# Patient Record
Sex: Female | Born: 1995 | Race: White | Hispanic: No | Marital: Single | Smoking: Never smoker
Health system: Southern US, Community
[De-identification: ages and names within clinical notes are randomized; demographics above are authoritative.]

## PROBLEM LIST (undated history)

## (undated) DIAGNOSIS — G43909 Migraine, unspecified, not intractable, without status migrainosus: Secondary | ICD-10-CM

---

## 2016-08-28 ENCOUNTER — Encounter (HOSPITAL_COMMUNITY): Payer: Self-pay | Admitting: Nurse Practitioner

## 2016-08-28 DIAGNOSIS — R202 Paresthesia of skin: Secondary | ICD-10-CM | POA: Diagnosis present

## 2016-08-28 LAB — COMPREHENSIVE METABOLIC PANEL
ALT: 13 U/L — AB (ref 14–54)
ANION GAP: 9 (ref 5–15)
AST: 17 U/L (ref 15–41)
Albumin: 4.1 g/dL (ref 3.5–5.0)
Alkaline Phosphatase: 41 U/L (ref 38–126)
BUN: 14 mg/dL (ref 6–20)
CHLORIDE: 107 mmol/L (ref 101–111)
CO2: 22 mmol/L (ref 22–32)
Calcium: 9.2 mg/dL (ref 8.9–10.3)
Creatinine, Ser: 0.84 mg/dL (ref 0.44–1.00)
GFR calc non Af Amer: >60 mL/min (ref >60)
GLUCOSE: 122 mg/dL — AB (ref 65–99)
Potassium: 3.5 mmol/L (ref 3.5–5.1)
Sodium: 138 mmol/L (ref 135–145)
Total Bilirubin: 0.4 mg/dL (ref 0.3–1.2)
Total Protein: 7 g/dL (ref 6.5–8.1)

## 2016-08-28 LAB — CBC
HCT: 39.9 % (ref 36.0–46.0)
HEMOGLOBIN: 13.9 g/dL (ref 12.0–15.0)
MCH: 31 pg (ref 26.0–34.0)
MCHC: 34.8 g/dL (ref 30.0–36.0)
MCV: 89.1 fL (ref 78.0–100.0)
Platelets: 332 10*3/uL (ref 150–400)
RBC: 4.48 MIL/uL (ref 3.87–5.11)
RDW: 12.4 % (ref 11.5–15.5)
WBC: 5.4 10*3/uL (ref 4.0–10.5)

## 2016-08-28 NOTE — ED Triage Notes (Signed)
Pt states this a.m when she woke up, she noticed that her left leg felt numb and had pins and needles sensation. On the course of the day, the symptoms progressed to left arm. She denies any neurological or autoimmune hx  Except family hx of such.

## 2016-08-29 ENCOUNTER — Emergency Department (HOSPITAL_COMMUNITY): Payer: 59

## 2016-08-29 ENCOUNTER — Emergency Department (HOSPITAL_COMMUNITY)
Admission: EM | Admit: 2016-08-29 | Discharge: 2016-08-29 | Disposition: A | Discharge status: Home / Self Care | Payer: 59 | Attending: Emergency Medicine | Admitting: Emergency Medicine

## 2016-08-29 DIAGNOSIS — R202 Paresthesia of skin: Secondary | ICD-10-CM

## 2016-08-29 HISTORY — DX: Migraine, unspecified, not intractable, without status migrainosus: G43.909

## 2016-08-29 NOTE — ED Provider Notes (Signed)
[facility] DEPT Provider Note: Lowella Dell, MD, FACEP  CSN: [id number] MRN: [id number] ARRIVAL: 08/28/16 at 2234 ROOM: WA13/[facility]13   CHIEF COMPLAINT  Numbness (Left Sided)   HISTORY OF PRESENT ILLNESS  08/29/16 4:35 AM Natalie Warren is a 21 y.o. female who awoke yesterday morning with paresthesias in her left leg. She describes the paresthesias as a tingling feeling like "my leg went to sleep and is waking back up". She is not completely insensate. The paresthesias began about mid thigh and are present distally. About 8 PM yesterday evening she noticed paresthesias in her left arm. These paresthesias are present in the upper arm and forearm but spare the hand. She is not having any paresthesias of the trunk or face. There is no associated pain. Nothing makes the paresthesias better or worse. She has had no focal weakness. She has had no visual changes. She has had no ataxia or disequilibrium. She deniesheadache, fever, chills, nausea, vomiting, diarrhea, chest pain, shortness of breath, cough and abdominal pain. She is currently on her menses.   Past Medical History:  Diagnosis Date  . Migraine     History reviewed. No pertinent surgical history.  History reviewed. No pertinent family history.  Social History  Substance Use Topics  . Smoking status: Never Smoker  . Smokeless tobacco: Never Used  . Alcohol use Yes    Prior to Admission medications   Not on File    Allergies Patient has no known allergies.   REVIEW OF SYSTEMS  Negative except as noted here or in the History of Present Illness.   PHYSICAL EXAMINATION  Initial Vital Signs Blood pressure 127/83, pulse 84, temperature 98.6 F (37 C), temperature source Oral, resp. rate 16, weight 68 kg (150 lb), last menstrual period 08/28/2016, SpO2 98 %.  Examination General: Well-developed, well-nourished female in no acute distress; appearance consistent with age of record HENT: normocephalic;  atraumatic Eyes: pupils equal, round and reactive to light; extraocular muscles intact Neck: supple; no meningeal signs Heart: regular rate and rhythm Lungs: clear to auscultation bilaterally Abdomen: soft; nondistended; nontender; bowel sounds present Extremities: No deformity; full range of motion; pulses normal Neurologic: Awake, alert and oriented; motor function intact in all extremities and symmetric, strength +5/5; sensation intact bilaterally with subjectively altered sensation of the left forehead, left upper arm and forearm, and left lower extremity; no facial droop; tongue midline; negative Romberg; normal finger to nose; normal coordination, speech and gait Skin: Warm and dry Psychiatric: Normal mood and affect   RESULTS  Summary of this visit's results, reviewed by myself:   EKG Interpretation  Date/Time:    Ventricular Rate:    PR Interval:    QRS Duration:   QT Interval:    QTC Calculation:   R Axis:     Text Interpretation:        Laboratory Studies: Results for orders placed or performed during the hospital encounter of 08/29/16 (from the past 24 hour(s))  Comprehensive metabolic panel     Status: Abnormal   Collection Time: 08/28/16 11:05 PM  Result Value Ref Range   Sodium 138 135 - 145 mmol/L   Potassium 3.5 3.5 - 5.1 mmol/L   Chloride 107 101 - 111 mmol/L   CO2 22 22 - 32 mmol/L   Glucose, Bld 122 (H) 65 - 99 mg/dL   BUN 14 6 - 20 mg/dL   Creatinine, Ser 7.82 0.44 - 1.00 mg/dL   Calcium 9.2 8.9 - 95.6 mg/dL   Total  Protein 7.0 6.5 - 8.1 g/dL   Albumin 4.1 3.5 - 5.0 g/dL   AST 17 15 - 41 U/L   ALT 13 (L) 14 - 54 U/L   Alkaline Phosphatase 41 38 - 126 U/L   Total Bilirubin 0.4 0.3 - 1.2 mg/dL   GFR calc non Af Amer >60 >60 mL/min   GFR calc Af Amer >60 >60 mL/min   Anion gap 9 5 - 15  CBC     Status: None   Collection Time: 08/28/16 11:05 PM  Result Value Ref Range   WBC 5.4 4.0 - 10.5 K/uL   RBC 4.48 3.87 - 5.11 MIL/uL   Hemoglobin 13.9 12.0  - 15.0 g/dL   HCT 30.1 31.4 - 38.8 %   MCV 89.1 78.0 - 100.0 fL   MCH 31.0 26.0 - 34.0 pg   MCHC 34.8 30.0 - 36.0 g/dL   RDW 87.5 79.7 - 28.2 %   Platelets 332 150 - 400 K/uL   Imaging Studies: Ct Head Wo Contrast  Result Date: 08/29/2016 CLINICAL DATA:  LEFT-sided numbness, pins and needles sensation. EXAM: CT HEAD WITHOUT CONTRAST TECHNIQUE: Contiguous axial images were obtained from the base of the skull through the vertex without intravenous contrast. COMPARISON:  None. FINDINGS: BRAIN: No intraparenchymal hemorrhage, mass effect nor midline shift. The ventricles and sulci are normal. No acute large vascular territory infarcts. No abnormal extra-axial fluid collections. Basal cisterns are patent. VASCULAR: Unremarkable. SKULL/SOFT TISSUES: No skull fracture. No significant soft tissue swelling. ORBITS/SINUSES: The included ocular globes and orbital contents are normal.The mastoid aircells and included paranasal sinuses are well-aerated. OTHER: None. IMPRESSION: Normal noncontrast CT HEAD. Electronically Signed   By: Awilda Metro M.D.   On: 08/29/2016 05:08    ED COURSE  Nursing notes and initial vitals signs, including pulse oximetry, reviewed.  Vitals:   08/28/16 2250 08/29/16 0417  BP: 139/88 127/83  Pulse: 93 84  Resp: 20 16  Temp: 98.6 F (37 C)   TempSrc: Oral   SpO2: 99% 98%  Weight: 68 kg (150 lb)    5:52 AM Transfer to [facility] ED for MRI of the brain.  PROCEDURES    ED DIAGNOSES     ICD-10-CM   1. Paresthesias R20.2        Paula Libra, MD 08/29/16 [phone]

## 2016-08-29 NOTE — Discharge Instructions (Signed)
Proceed to the [facility] emergency department for an MRI of the brain. Tell them you were sent from [facility] because of left-sided paresthesias and no MRI was available there today.

## 2016-08-29 NOTE — ED Notes (Signed)
Report called to charge nurse.

## 2018-01-22 IMAGING — CT CT HEAD W/O CM
3 of 4 series · 16 of 47 positions shown, 19 images · non-contrast
Comparison: None.

CLINICAL DATA: LEFT-sided numbness, pins and needles sensation.

EXAM:
CT HEAD WITHOUT CONTRAST
TECHNIQUE: Contiguous axial images were obtained from the base of the skull
through the vertex without intravenous contrast.

[Series 2: head w/o · axial · non-contrast · 0.45mm/px · z∈[-193,-73]mm · 10 of 30 slices shown, 13 images]
[im 3/30  brain]
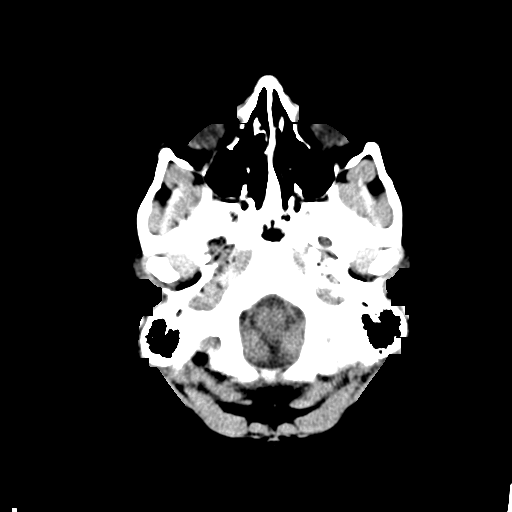
[im 3/30  bone]
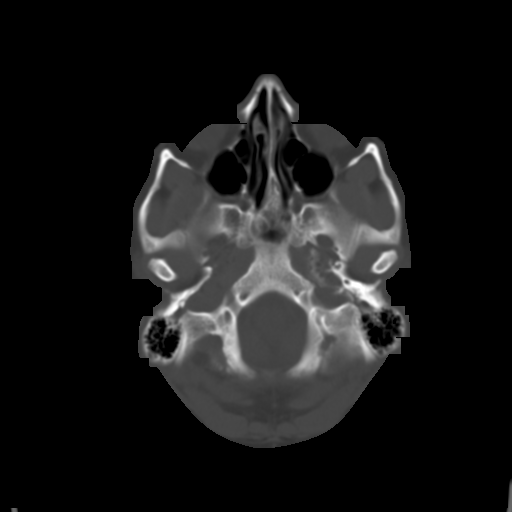
[im 5/30  brain]
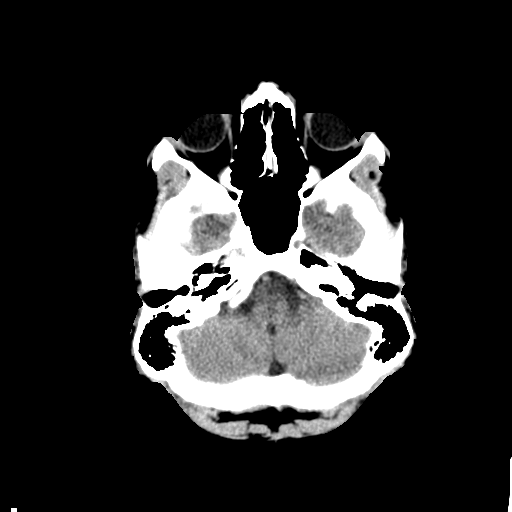
[im 9/30  brain]
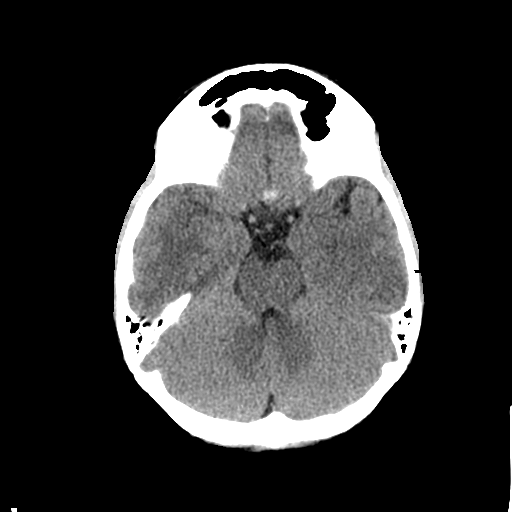
[im 11/30  brain]
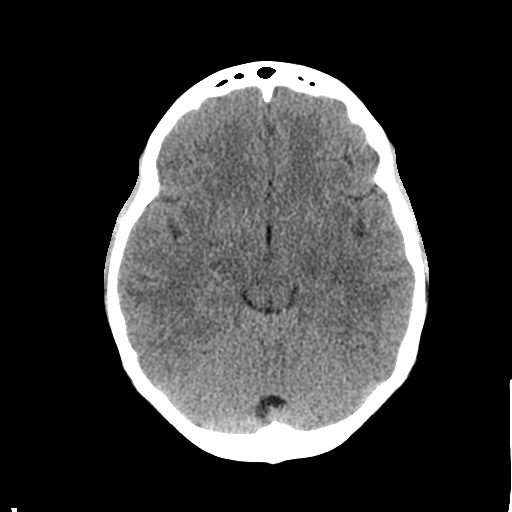
[im 13/30  brain]
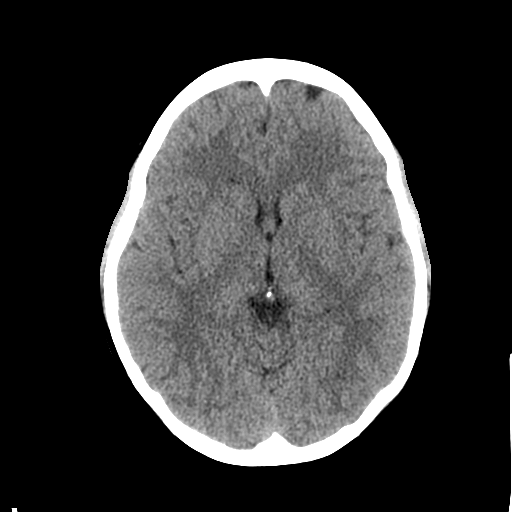
[im 13/30  bone]
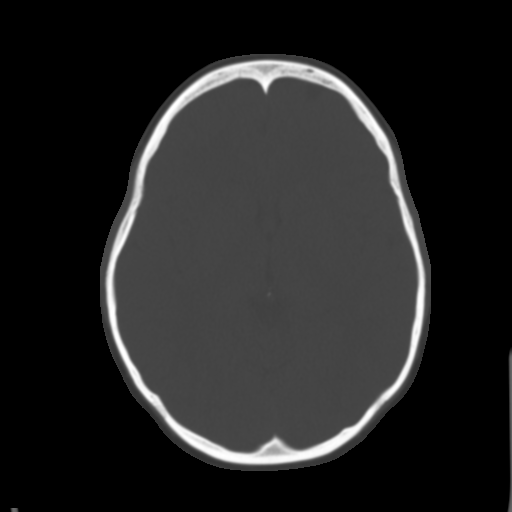
[im 17/30  brain]
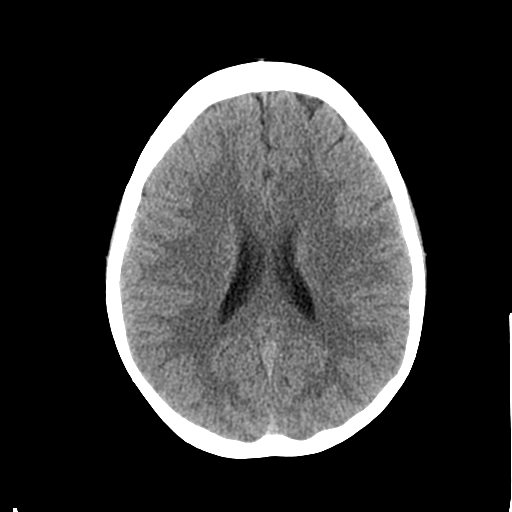
[im 19/30  brain]
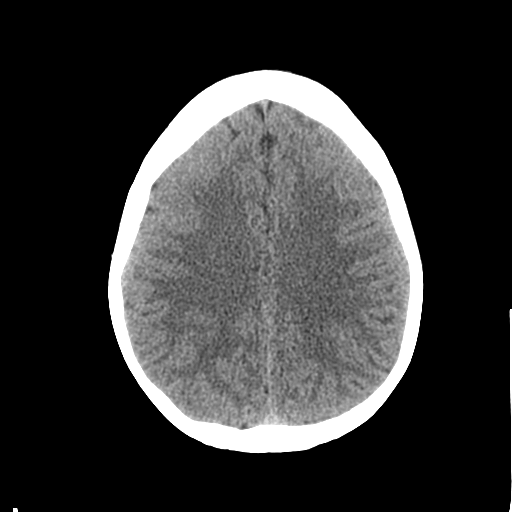
[im 21/30  brain]
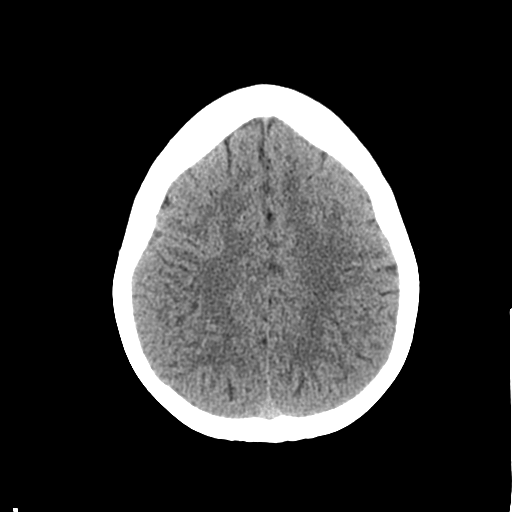
[im 25/30  brain]
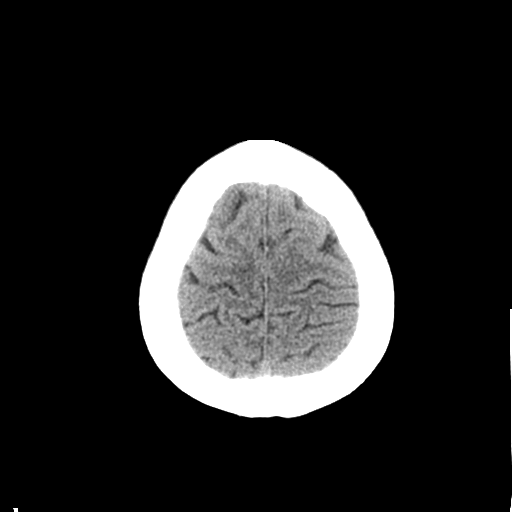
[im 25/30  bone]
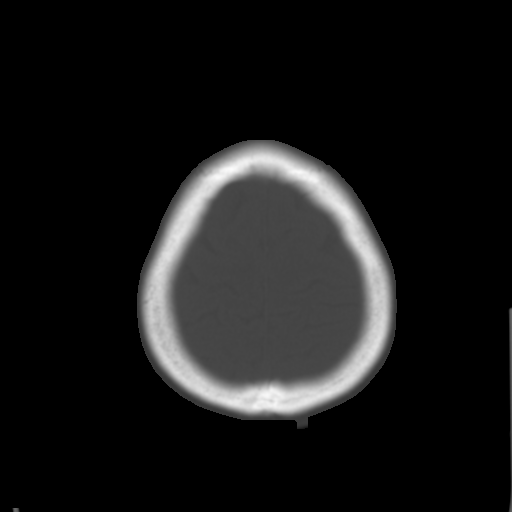
[im 27/30  brain]
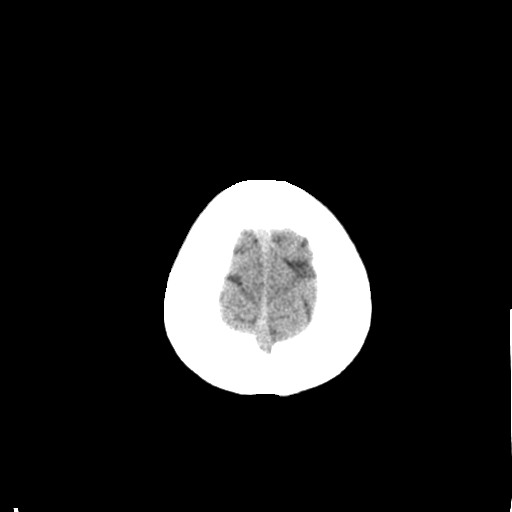

[Series 5: coronal · coronal · 0.29mm/px · 3 of 61 slices shown]
[im 21/61  brain]
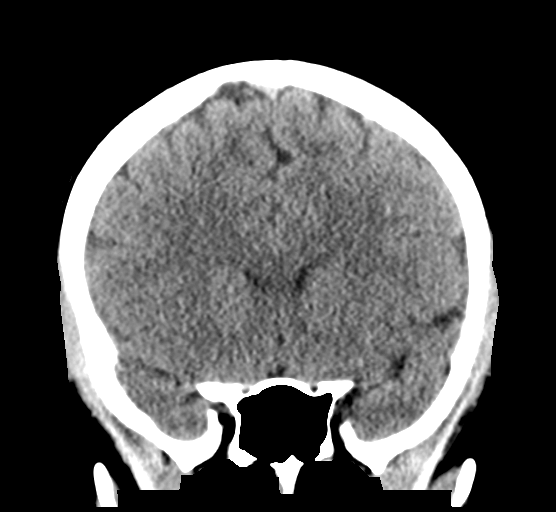
[im 27/61  brain]
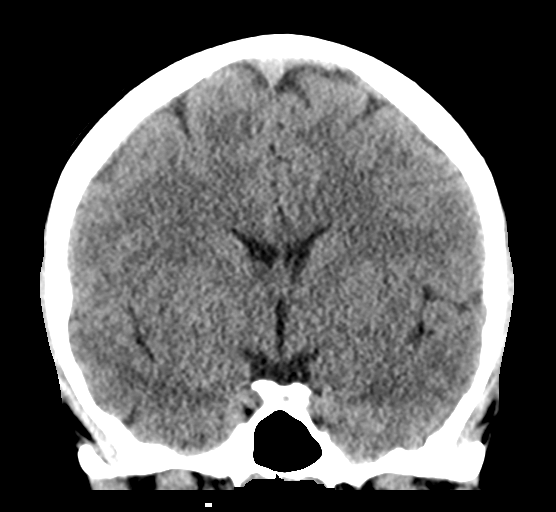
[im 34/61  brain]
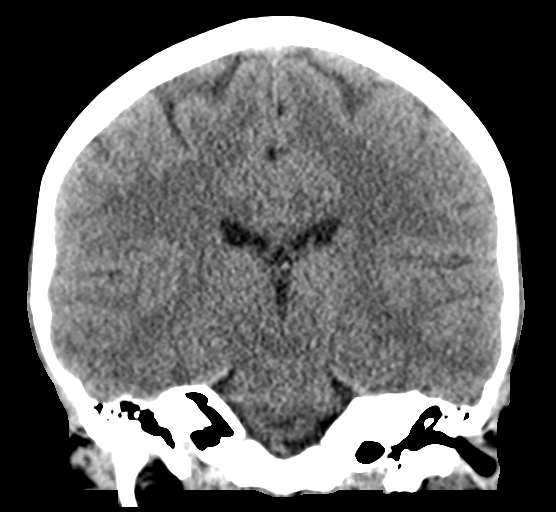

[Series 6: sagittal · sagittal · 0.32mm/px · 3 of 49 slices shown]
[im 17/49  brain]
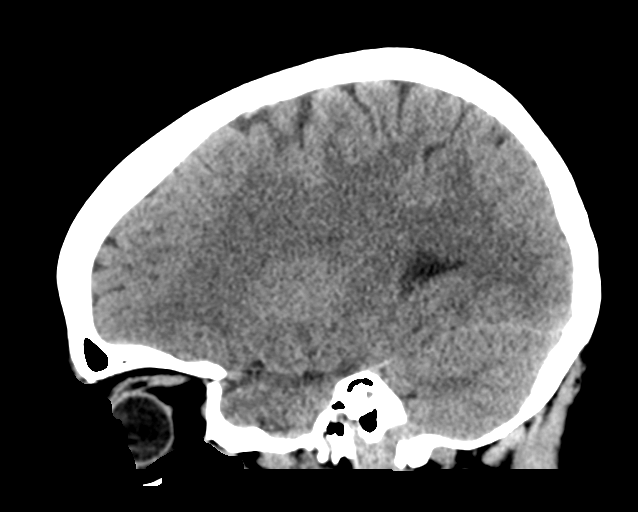
[im 25/49  brain]
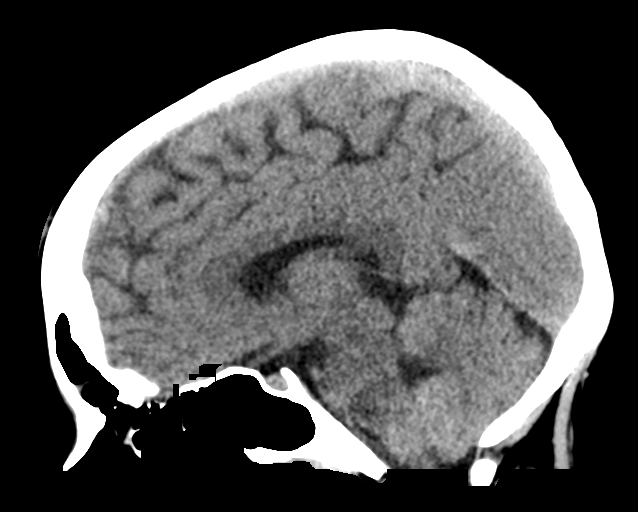
[im 33/49  brain]
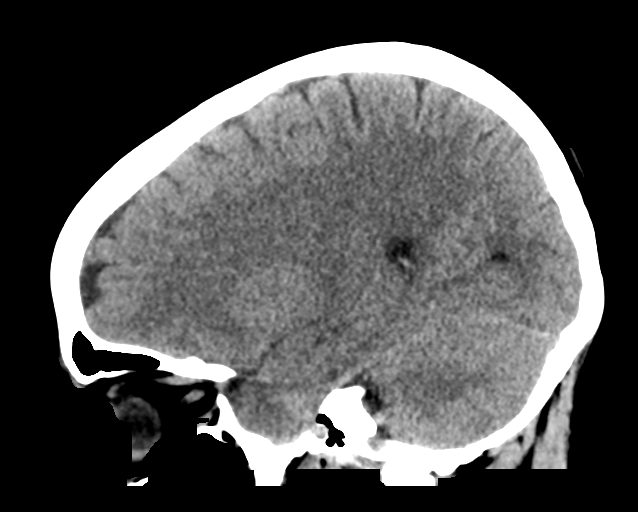

[16 of 47 positions shown; findings below may reference images not displayed]

FINDINGS: BRAIN: No intraparenchymal hemorrhage, mass effect nor midline
shift. The ventricles and sulci are normal. No acute large vascular
territory infarcts. No abnormal extra-axial fluid collections. Basal
cisterns are patent.

VASCULAR: Unremarkable.

SKULL/SOFT TISSUES: No skull fracture. No significant soft tissue
swelling.

ORBITS/SINUSES: The included ocular globes and orbital contents are
normal.The mastoid aircells and included paranasal sinuses are
well-aerated.

OTHER: None.
IMPRESSION: Normal noncontrast CT HEAD.
